# Patient Record
Sex: Male | Born: 1955 | Hispanic: Yes | Marital: Single | Smoking: Current every day smoker
Health system: Southern US, Community
[De-identification: ages and names within clinical notes are randomized; demographics above are authoritative.]

---

## 2015-02-08 ENCOUNTER — Emergency Department (HOSPITAL_BASED_OUTPATIENT_CLINIC_OR_DEPARTMENT_OTHER)
Admission: EM | Admit: 2015-02-08 | Discharge: 2015-02-08 | Disposition: A | Discharge status: Home / Self Care | Payer: Self-pay | Attending: Emergency Medicine | Admitting: Emergency Medicine

## 2015-02-08 ENCOUNTER — Encounter (HOSPITAL_BASED_OUTPATIENT_CLINIC_OR_DEPARTMENT_OTHER): Payer: Self-pay | Admitting: Family Medicine

## 2015-02-08 ENCOUNTER — Emergency Department (HOSPITAL_BASED_OUTPATIENT_CLINIC_OR_DEPARTMENT_OTHER): Payer: Self-pay

## 2015-02-08 DIAGNOSIS — J159 Unspecified bacterial pneumonia: Secondary | ICD-10-CM | POA: Insufficient documentation

## 2015-02-08 DIAGNOSIS — R21 Rash and other nonspecific skin eruption: Secondary | ICD-10-CM | POA: Insufficient documentation

## 2015-02-08 DIAGNOSIS — J189 Pneumonia, unspecified organism: Secondary | ICD-10-CM

## 2015-02-08 DIAGNOSIS — Z720 Tobacco use: Secondary | ICD-10-CM | POA: Insufficient documentation

## 2015-02-08 LAB — COMPREHENSIVE METABOLIC PANEL
ALT: 17 U/L (ref 17–63)
ANION GAP: 7 (ref 5–15)
AST: 21 U/L (ref 15–41)
Albumin: 2.8 g/dL — ABNORMAL LOW (ref 3.5–5.0)
Alkaline Phosphatase: 181 U/L — ABNORMAL HIGH (ref 38–126)
BUN: 12 mg/dL (ref 6–20)
CALCIUM: 8.4 mg/dL — AB (ref 8.9–10.3)
CHLORIDE: 103 mmol/L (ref 101–111)
CO2: 27 mmol/L (ref 22–32)
CREATININE: 0.57 mg/dL — AB (ref 0.61–1.24)
GFR calc Af Amer: >60 mL/min (ref >60)
Glucose, Bld: 112 mg/dL — ABNORMAL HIGH (ref 65–99)
Potassium: 3.8 mmol/L (ref 3.5–5.1)
Sodium: 137 mmol/L (ref 135–145)
Total Bilirubin: 0.3 mg/dL (ref 0.3–1.2)
Total Protein: 6.5 g/dL (ref 6.5–8.1)

## 2015-02-08 LAB — CBC WITH DIFFERENTIAL/PLATELET
BASOS PCT: 0 % (ref 0–1)
Basophils Absolute: 0 10*3/uL (ref 0.0–0.1)
Eosinophils Absolute: 1.8 10*3/uL — ABNORMAL HIGH (ref 0.0–0.7)
Eosinophils Relative: 13 % — ABNORMAL HIGH (ref 0–5)
HEMATOCRIT: 34.7 % — AB (ref 39.0–52.0)
Hemoglobin: 12.3 g/dL — ABNORMAL LOW (ref 13.0–17.0)
LYMPHS ABS: 1.5 10*3/uL (ref 0.7–4.0)
LYMPHS PCT: 11 % — AB (ref 12–46)
MCH: 34.8 pg — ABNORMAL HIGH (ref 26.0–34.0)
MCHC: 35.4 g/dL (ref 30.0–36.0)
MCV: 98.3 fL (ref 78.0–100.0)
MONOS PCT: 13 % — AB (ref 3–12)
Monocytes Absolute: 1.8 10*3/uL — ABNORMAL HIGH (ref 0.1–1.0)
NEUTROS ABS: 8.7 10*3/uL — AB (ref 1.7–7.7)
Neutrophils Relative %: 63 % (ref 43–77)
Platelets: 495 10*3/uL — ABNORMAL HIGH (ref 150–400)
RBC: 3.53 MIL/uL — ABNORMAL LOW (ref 4.22–5.81)
RDW: 12.7 % (ref 11.5–15.5)
WBC: 13.8 10*3/uL — AB (ref 4.0–10.5)

## 2015-02-08 LAB — URINALYSIS, ROUTINE W REFLEX MICROSCOPIC
Bilirubin Urine: NEGATIVE
Glucose, UA: NEGATIVE mg/dL
Ketones, ur: NEGATIVE mg/dL
Leukocytes, UA: NEGATIVE
Nitrite: NEGATIVE
PROTEIN: 100 mg/dL — AB
SPECIFIC GRAVITY, URINE: 1.015 (ref 1.005–1.030)
Urobilinogen, UA: 0.2 mg/dL (ref 0.0–1.0)
pH: 6.5 (ref 5.0–8.0)

## 2015-02-08 LAB — URINE MICROSCOPIC-ADD ON

## 2015-02-08 LAB — APTT: aPTT: 30 seconds (ref 24–37)

## 2015-02-08 LAB — BRAIN NATRIURETIC PEPTIDE: B Natriuretic Peptide: 400.7 pg/mL — ABNORMAL HIGH (ref 0.0–100.0)

## 2015-02-08 LAB — PROTIME-INR
INR: 1.05 (ref 0.00–1.49)
Prothrombin Time: 13.9 seconds (ref 11.6–15.2)

## 2015-02-08 MED ORDER — METRONIDAZOLE 500 MG PO TABS: 500.0000 mg | ORAL_TABLET | Freq: Once | ORAL | Status: DC

## 2015-02-08 MED ORDER — MORPHINE SULFATE 4 MG/ML IJ SOLN
4.0000 mg | INTRAMUSCULAR | Status: DC | PRN
  Administered 2015-02-08: 4 mg via INTRAVENOUS
  Filled 2015-02-08: qty 1

## 2015-02-08 MED ORDER — BACITRACIN ZINC 500 UNIT/GM EX OINT
TOPICAL_OINTMENT | Freq: Once | CUTANEOUS | Status: AC
  Administered 2015-02-08: 17:00:00 via TOPICAL
  Filled 2015-02-08: qty 28.35

## 2015-02-08 MED ORDER — CLINDAMYCIN HCL 150 MG PO CAPS: 450.0000 mg | ORAL_CAPSULE | Freq: Three times a day (TID) | ORAL | Status: DC

## 2015-02-08 MED ORDER — ONDANSETRON HCL 4 MG/2ML IJ SOLN
4.0000 mg | Freq: Once | INTRAMUSCULAR | Status: AC
  Administered 2015-02-08: 4 mg via INTRAVENOUS
  Filled 2015-02-08: qty 2

## 2015-02-08 MED ORDER — SODIUM CHLORIDE 0.9 % IV BOLUS (SEPSIS)
1000.0000 mL | Freq: Once | INTRAVENOUS | Status: AC
Start: 2015-02-08 — End: 2015-02-08
  Administered 2015-02-08: 1000 mL via INTRAVENOUS

## 2015-02-08 MED ORDER — CLINDAMYCIN HCL 150 MG PO CAPS
300.0000 mg | ORAL_CAPSULE | Freq: Once | ORAL | Status: AC
  Administered 2015-02-08: 300 mg via ORAL
  Filled 2015-02-08: qty 2

## 2015-02-08 MED ORDER — DEXTROSE 5 % IV SOLN
1.0000 g | Freq: Once | INTRAVENOUS | Status: AC
  Administered 2015-02-08: 1 g via INTRAVENOUS

## 2015-02-08 MED ORDER — CEFTRIAXONE SODIUM 1 G IJ SOLR
INTRAMUSCULAR | Status: AC
  Filled 2015-02-08: qty 10

## 2015-02-08 NOTE — ED Provider Notes (Signed)
CSN: [id number]     Arrival date & time 02/08/15  1503 History   First MD Initiated Contact with Patient 02/08/15 1511     Chief Complaint  Patient presents with  . Rash     (Consider location/radiation/quality/duration/timing/severity/associated sxs/prior Treatment) HPI   Marvin Olson is a 60 y.o. male complaining of pruritic rash to entire body, patient started having peeling to the hands and feet several weeks ago, peeling is painful, he has bilateral lower extremity edema starting after the rash. He reports that he's had a subjective fever 2 times since the onset of the rash. Patient is homeless but states he has a steady place to stay with a friend inside, he denies any abuse including IV drugs. Patient states that he drinks pretty much daily, sometimes he gets shaky if he doesn't have alcohol, denies history of DTs or seizures. Has not had alcohol in 4 days. States that he hasn't slept in 2 days because of the pain to the hands and the feet. States his last tetanus shot was 7 years ago. No PCP, no PMH. Pt had been applying vaseline to the hands and feet with little relief. No new meds, exposures, tick bites.   History reviewed. No pertinent past medical history. History reviewed. No pertinent past surgical history. No family history on file. History  Substance Use Topics  . Smoking status: Current Every Day Smoker    Types: Cigarettes  . Smokeless tobacco: Not on file  . Alcohol Use: Yes    Review of Systems  10 systems reviewed and found to be negative, except as noted in the HPI.   Allergies  Review of patient's allergies indicates no known allergies.  Home Medications   Prior to Admission medications   Medication Sig Start Date End Date Taking? Authorizing Provider  clindamycin (CLEOCIN) 150 MG capsule Take 3 capsules (450 mg total) by mouth 3 (three) times daily. 02/08/15   Darryl Blumenstein, PA-C   BP 148/89 mmHg  Pulse 77  Temp(Src) 98.3 F (36.8 C) (Oral)  Resp  16  Ht  (1.753 m)  Wt 135 lb (61.236 kg)  BMI 19.93 kg/m2  SpO2 98% Physical Exam  Constitutional: He is oriented to person, place, and time. He appears well-developed and well-nourished. No distress.  HENT:  Head: Normocephalic and atraumatic.  Mouth/Throat: Oropharynx is clear and moist.  Eyes: Conjunctivae and EOM are normal. Pupils are equal, round, and reactive to light.  Neck: Normal range of motion. Neck supple.  Cardiovascular: Normal rate, regular rhythm and intact distal pulses.   Pulmonary/Chest: Effort normal and breath sounds normal.  Abdominal: Soft. Bowel sounds are normal. There is no tenderness.  Musculoskeletal: Normal range of motion. He exhibits tenderness.  Atrophic skin to lower extremities, 3+ pitting edema to distal shin bilaterally.  Neurological: He is alert and oriented to person, place, and time.  Skin: Rash noted. He is not diaphoretic.  Maculopapular, blanchable, confluent lesions to trunk x4 extremities.   Desquamation to palms and soles with active, venous bleeding.  Lesions spare the mucous membranes.  Psychiatric: He has a normal mood and affect.  Nursing note and vitals reviewed.               ED Course  Procedures (including critical care time) Labs Review Labs Reviewed  CBC WITH DIFFERENTIAL/PLATELET - Abnormal; Notable for the following:    WBC 13.8 (*)    RBC 3.53 (*)    Hemoglobin 12.3 (*)    HCT  34.7 (*)    MCH 34.8 (*)    Platelets 495 (*)    Lymphocytes Relative 11 (*)    Monocytes Relative 13 (*)    Eosinophils Relative 13 (*)    Neutro Abs 8.7 (*)    Monocytes Absolute 1.8 (*)    Eosinophils Absolute 1.8 (*)    All other components within normal limits  COMPREHENSIVE METABOLIC PANEL - Abnormal; Notable for the following:    Glucose, Bld 112 (*)    Creatinine, Ser 0.57 (*)    Calcium 8.4 (*)    Albumin 2.8 (*)    Alkaline Phosphatase 181 (*)    All other components within normal limits  URINALYSIS,  ROUTINE W REFLEX MICROSCOPIC - Abnormal; Notable for the following:    Hgb urine dipstick SMALL (*)    Protein, ur 100 (*)    All other components within normal limits  BRAIN NATRIURETIC PEPTIDE - Abnormal; Notable for the following:    B Natriuretic Peptide 400.7 (*)    All other components within normal limits  PROTIME-INR  APTT  URINE MICROSCOPIC-ADD ON  RPR  HIV ANTIBODY (ROUTINE TESTING)  ROCKY MTN SPOTTED FVR ABS PNL(IGG+IGM)  B. BURGDORFI ANTIBODIES  GC/CHLAMYDIA PROBE AMP ([place])    Imaging Review Dg Chest 2 View  02/08/2015   CLINICAL DATA:  Rash for 1 month, hands peeling for 3 weeks, smoker  EXAM: CHEST  2 VIEW  COMPARISON:  None  FINDINGS: Normal heart size, mediastinal contours and pulmonary vascularity.  RIGHT nipple shadow, question faint LEFT nipple shadow.  Small focus of infiltrate in RIGHT mid lung likely in upper lobe near minor fissure.  Remaining lungs clear.  No pleural effusion or pneumothorax.  No acute osseous findings.  IMPRESSION: Minimal infiltrate RIGHT upper lobe.   Electronically Signed   By: Ulyses SouthwardMark  Boles M.D.   On: 02/08/2015 16:15     EKG Interpretation None      MDM   Final diagnoses:  Rash and nonspecific skin eruption  CAP (community acquired pneumonia)    Filed Vitals:   02/08/15 1515 02/08/15 1820  BP: 165/97 148/89  Pulse: 76 77  Temp: 97.6 F (36.4 C) 98.3 F (36.8 C)  TempSrc: Oral Oral  Resp: 18 16  Height: 5\' 9"  (1.753 m)   Weight: 135 lb (61.236 kg)   SpO2: 99% 98%    Medications  morphine 4 MG/ML injection 4 mg (4 mg Intravenous Given 02/08/15 1619)  cefTRIAXone (ROCEPHIN) 1 G injection (not administered)  sodium chloride 0.9 % bolus 1,000 mL (0 mLs Intravenous Stopped 02/08/15 1730)  ondansetron (ZOFRAN) injection 4 mg (4 mg Intravenous Given 02/08/15 1619)  cefTRIAXone (ROCEPHIN) 1 g in dextrose 5 % 50 mL IVPB (0 g Intravenous Stopped 02/08/15 1810)  clindamycin (CLEOCIN) capsule 300 mg (300 mg Oral Given 02/08/15  1715)  bacitracin ointment ( Topical Given 02/08/15 1713)    Leafy HalfJuan R Olson is a pleasant 59 y.o. male presenting with lytic maculopapular rash to all skin surfaces starting 3 weeks ago progressing to, Asian of the palms and soles. Patient reports intermittent fever, he is afebrile in the ED. Patient has a leukocytosis of 13, patient is a heavy alcohol drinker however his liver function tests and coags are normal. There is significant bilateral lower extremity pitting edema. States that this started after the onset of rash. Lesions affecting any other mucous membranes.  Chest x-ray shows a right middle lobe infiltrate. Patient denies cough, chest pain, shortness of breath, patient  will be started on Rocephin and clindamycin for possible early aspiration pneumonia. Patient is homeless however he is living with friends, a spoken to him and these friends on return precautions. I've advised him to follow closely with the wellness Center. Patient states that he's tried to get Medicaid but was told that without being sick he did not qualify.  Evaluation does not show pathology that would require ongoing emergent intervention or inpatient treatment. Pt is hemodynamically stable and mentating appropriately. Discussed findings and plan with patient/guardian, who agrees with care plan. All questions answered. Return precautions discussed and outpatient follow up given.   Discharge Medication List as of 02/08/2015  6:04 PM    START taking these medications   Details  clindamycin (CLEOCIN) 150 MG capsule Take 3 capsules (450 mg total) by mouth 3 (three) times daily., Starting 02/08/2015, Until Discontinued, Print           l  Wynetta Emeryicole Kameron Blethen, PA-C 02/08/15 1932  Glynn OctaveStephen Rancour, MD 02/09/15 1320

## 2015-02-08 NOTE — ED Notes (Signed)
Ambulated patient O2 stayed between 100-96%

## 2015-02-08 NOTE — ED Notes (Signed)
Pt c/o rash x 1 month and hands peeling x 3 weeks. Denies fever, chills, n/v/d.

## 2015-02-08 NOTE — Discharge Instructions (Signed)
Do not hesitate to return to the Emergency Department for any new, worsening or concerning symptoms.   If you do not have a primary care doctor you can establish one at the   [facility]: [address] [place] [id number] [phone]  After you establish care. Let them know you were seen in the emergency room. They must obtain records for further management.    Do not apply Vaseline to the affected area, use a topical antibiotic ointment like the tracer Neosporin. Do not hesitate to return to the emergency room for any new, worsening or concerning symptoms.

## 2015-02-09 LAB — HIV ANTIBODY (ROUTINE TESTING W REFLEX): HIV Screen 4th Generation wRfx: NONREACTIVE

## 2015-02-09 LAB — RPR: RPR Ser Ql: NONREACTIVE

## 2015-02-09 LAB — B. BURGDORFI ANTIBODIES: B burgdorferi Ab IgG+IgM: <0.91 {ISR} (ref 0.00–0.90)

## 2015-02-11 LAB — ROCKY MTN SPOTTED FVR ABS PNL(IGG+IGM)
RMSF IGG: NEGATIVE
RMSF IGM: 0.42 {index} (ref 0.00–0.89)

## 2015-02-12 ENCOUNTER — Ambulatory Visit: Payer: Self-pay | Attending: Family Medicine | Admitting: Family Medicine

## 2015-02-12 VITALS — BP 150/82 | HR 98 | Wt 148.4 lb

## 2015-02-12 DIAGNOSIS — I10 Essential (primary) hypertension: Secondary | ICD-10-CM | POA: Insufficient documentation

## 2015-02-12 DIAGNOSIS — B352 Tinea manuum: Secondary | ICD-10-CM | POA: Insufficient documentation

## 2015-02-12 DIAGNOSIS — R21 Rash and other nonspecific skin eruption: Secondary | ICD-10-CM

## 2015-02-12 LAB — SEDIMENTATION RATE: Sed Rate: 25 mm/hr — ABNORMAL HIGH (ref 0–20)

## 2015-02-12 LAB — C-REACTIVE PROTEIN: CRP: 2.9 mg/dL — ABNORMAL HIGH (ref <0.60)

## 2015-02-12 MED ORDER — HYDROCHLOROTHIAZIDE 25 MG PO TABS: 25.0000 mg | ORAL_TABLET | Freq: Every day | ORAL | Status: DC

## 2015-02-12 MED ORDER — DIPHENHYDRAMINE HCL 25 MG PO CAPS: 25.0000 mg | ORAL_CAPSULE | Freq: Four times a day (QID) | ORAL | Status: DC | PRN

## 2015-02-12 NOTE — Progress Notes (Signed)
Patient ID: Marvin Olson, male   DOB: July 18, 1956, 59 y.o.   MRN: [id number]   Marvin Olson, is a 59 y.o. male  NTI:[id number]  QQP:[id number]  DOB - 03/02/1956  CC: No chief complaint on file.      HPI: Marvin Olson is a 59 y.o. male here today to establish medical care. He was seen in ED on 5/20 for non-specific skin rash and CAP. He is being treated with Clindamycin and reports today for follow-up. Currently is only medication is the clindamycin. He is using bactitracin provided in ED on his skin. He states he was unaware of a pulmonary condition before going to the ED. A x-ray showed infiltrate on the right, probably in upper lobe.  He is homeless but lives with a friend.   No Known Allergies No past medical history on file. Current Outpatient Prescriptions on File Prior to Visit  Medication Sig Dispense Refill  . clindamycin (CLEOCIN) 150 MG capsule Take 3 capsules (450 mg total) by mouth 3 (three) times daily. 90 capsule 0   No current facility-administered medications on file prior to visit.   No family history on file. History   Social History  . Marital Status: Single    Spouse Name: N/A  . Number of Children: N/A  . Years of Education: N/A   Occupational History  . Not on file.   Social History Main Topics  . Smoking status: Current Every Day Smoker    Types: Cigarettes  . Smokeless tobacco: Not on file  . Alcohol Use: Yes  . Drug Use: No  . Sexual Activity: Not on file   Other Topics Concern  . Not on file   Social History Narrative  . No narrative on file    Review of Systems: Constitutional: Negative for fever, chills,  weight loss,  Fatigue.Positive for fluctuation in appetite. He reports sweating a lot. HENT: Negative for ear pain, ear discharge.nose bleeds. Positive for some seasonal allergy symptoms Eyes: Negative for pain, discharge, redness, itching and visual disturbance.Positive fore occassional burning of eyes. Neck: Negative for pain,  stiffness Respiratory: Negative for  shortness of breath,  Positive for chronic cough, especially at night. Cardiovascular: Negative for chest pain, palpitations. Positive for swelling of feet and legs Gastrointestinal: Negative for abdominal distention, abdominal pain, nausea, vomiting,  Constipations. Positive for intermittent diarrhea and some abdominal pain related to hernia Genitourinary: Negative for dysuria, urgency, frequency, hematuria, flank pain,  Musculoskeletal: Negative for back pain, joint pain, joint  swelling, arthralgia and gait problem.Negative for weakness. Positive for difficulty walking at times due to swelling. Uses a cane at times. Neurological: Negative for dizziness,, seizures, syncope,  numbness and  Frequent headaches. Positive for tremors, off-balance Hematological: Negative for easy bruising or bleeding Psychiatric/Behavioral: Negative for anxiety, decreased concentration, confusion. Admits to occassional situational depression   Objective:  There were no vitals filed for this visit.  Physical Exam: Constitutional: Patient appears well-developed and well-nourished. No distress. HENT: Normocephalic, atraumatic, External right and left ear normal. Oropharynx is clear and moist. Teeth in poor repair Eyes: Conjunctivae and EOM are normal. PERRLA, no scleral icterus. Neck: Normal ROM. Neck supple. No lymphadenopathy, No thyromegaly. CVS: RRR, S1/S2 +, no murmurs, no gallops, no rubs Pulmonary: Effort and breath sounds normal, no stridor, rhonchi, rales. Positivie for faint high-pitched wheeze in left upper lobe. Abdominal: Soft. Normoactive BS,, no distension, tenderness, rebound or guarding.  Musculoskeletal: Normal range of motion  Positive for 3+ edema and  Tenderness of lower legs.  Neuro: Alert.Normal muscle tone coordination. Non-focal Skin: Skin is warm and dry. Not diaphoretic. No erythema. No pallor.There is a wide spread undefined rash with peeling of palms  and soles. Psychiatric: Normal mood and affect. Behavior, judgment, thought content normal.  Lab Results  Component Value Date   WBC 13.8* 02/08/2015   HGB 12.3* 02/08/2015   HCT 34.7* 02/08/2015   MCV 98.3 02/08/2015   PLT 495* 02/08/2015   Lab Results  Component Value Date   CREATININE 0.57* 02/08/2015   BUN 12 02/08/2015   NA 137 02/08/2015   K 3.8 02/08/2015   CL 103 02/08/2015   CO2 27 02/08/2015    No results found for: HGBA1C Lipid Panel  No results found for: CHOL, TRIG, HDL, CHOLHDL, VLDL, LDLCALC     Assessment and plan:   1. Non-specific rash     A. ESR and CRP     B. Continue bacitracin     C. Referral to dermatology when financial aid established. 2. Hypertension with pedal edema     A. HCTZ 25 mg. qday 3. Tremors and off-balance at times     Follow-up with PCP      1,2,3, Follow-up with PCP within next 2 weeks.    The patient was given clear instructions to go to ER or return to medical center if symptoms don't improve, worsen or new problems develop. The patient verbalized understanding. The patient was told to call to get lab results if they haven't heard anything in the next week.        Micheline Chapman, MSN, FNP-BC [place], [place]   02/12/2015, 9:13 AM

## 2015-02-12 NOTE — Patient Instructions (Addendum)
1.  See financial people about financial assistance. 2.  Make an appointment with assigned primary provider in one to two weeks. 3.  I am prescribing medication for hypertension and swelling of feet and legs.Take as instructed. 4.  I am prescribing benadryl for itching.  5.  You can get a good skin lotion at the [facility] for $1.00. I would recommend applying twice a day.  6.   We are drawing some additional lab work today. Will call with results. 7.  Once financial aid in place with try to get dermatology referral.

## 2015-02-13 MED ORDER — TRIAMCINOLONE ACETONIDE 0.1 % EX CREA: 1.0000 "application " | TOPICAL_CREAM | Freq: Two times a day (BID) | CUTANEOUS | Status: DC

## 2015-02-13 NOTE — Addendum Note (Signed)
Addended by: Henrietta HooverBERNHARDT, LINDA C on: 02/13/2015 04:09 PM   Modules accepted: Orders

## 2015-02-19 ENCOUNTER — Ambulatory Visit: Payer: Self-pay | Attending: Physician Assistant

## 2015-02-26 ENCOUNTER — Ambulatory Visit: Payer: Self-pay | Attending: Family Medicine | Admitting: Family Medicine

## 2015-02-26 ENCOUNTER — Encounter: Payer: Self-pay | Admitting: Family Medicine

## 2015-02-26 VITALS — BP 137/74 | HR 69 | Temp 98.8°F | Resp 16 | Ht 69.0 in | Wt 142.0 lb

## 2015-02-26 DIAGNOSIS — M2540 Effusion, unspecified joint: Secondary | ICD-10-CM | POA: Insufficient documentation

## 2015-02-26 DIAGNOSIS — I10 Essential (primary) hypertension: Secondary | ICD-10-CM | POA: Insufficient documentation

## 2015-02-26 DIAGNOSIS — R21 Rash and other nonspecific skin eruption: Secondary | ICD-10-CM | POA: Insufficient documentation

## 2015-02-26 DIAGNOSIS — F1010 Alcohol abuse, uncomplicated: Secondary | ICD-10-CM | POA: Insufficient documentation

## 2015-02-26 LAB — CBC
HCT: 34.7 % — ABNORMAL LOW (ref 39.0–52.0)
HEMOGLOBIN: 12 g/dL — AB (ref 13.0–17.0)
MCH: 34.3 pg — AB (ref 26.0–34.0)
MCHC: 34.6 g/dL (ref 30.0–36.0)
MCV: 99.1 fL (ref 78.0–100.0)
MPV: 8 fL — ABNORMAL LOW (ref 8.6–12.4)
Platelets: 655 10*3/uL — ABNORMAL HIGH (ref 150–400)
RBC: 3.5 MIL/uL — ABNORMAL LOW (ref 4.22–5.81)
RDW: 14 % (ref 11.5–15.5)
WBC: 11.6 10*3/uL — AB (ref 4.0–10.5)

## 2015-02-26 LAB — URIC ACID: Uric Acid, Serum: 5.2 mg/dL (ref 4.0–7.8)

## 2015-02-26 MED ORDER — DIPHENHYDRAMINE HCL 25 MG PO CAPS: 25.0000 mg | ORAL_CAPSULE | Freq: Four times a day (QID) | ORAL | Status: DC | PRN

## 2015-02-26 MED ORDER — AMLODIPINE BESYLATE 5 MG PO TABS: 5.0000 mg | ORAL_TABLET | Freq: Every day | ORAL | Status: DC

## 2015-02-26 MED ORDER — VITAMIN B-1 100 MG PO TABS: 100.0000 mg | ORAL_TABLET | Freq: Every day | ORAL | Status: AC

## 2015-02-26 MED ORDER — HYDROCHLOROTHIAZIDE 12.5 MG PO TABS: 12.5000 mg | ORAL_TABLET | Freq: Every day | ORAL | Status: DC

## 2015-02-26 MED ORDER — FOLIC ACID 1 MG PO TABS: 1.0000 mg | ORAL_TABLET | Freq: Every day | ORAL | Status: AC

## 2015-02-26 MED ORDER — PERMETHRIN 5 % EX CREA: TOPICAL_CREAM | CUTANEOUS | Status: DC

## 2015-02-26 NOTE — Patient Instructions (Signed)
Mr. Marvin Olson,   Thank you for coming in today.   1.  I suspect scabies: permetherin cream ordered Stop steroid cream Benadryl for itching increase to 50 mg   2.  Hypertension: Decrease HCTZ to 12.5 mg daily Add norvasc 5 mg daily Checking uric acid level  3. Alcohol abuse history: Start folic acid and thiamine  F/u in 4 weeks for skin rash, presumed scabies and hypertension  Dr. Armen PickupFunches

## 2015-02-26 NOTE — Assessment & Plan Note (Signed)
I suspect scabies: permetherin cream ordered Stop steroid cream Benadryl for itching increase to 50 mg

## 2015-02-26 NOTE — Progress Notes (Signed)
   Subjective:    Patient ID: Marvin Olson, male    DOB: 1956/05/11, 59 y.o.   MRN: [id number] CC: skin rash, L elbow swelling  Spanish interpreter present  HPI  1. Rash: x 5 weeks. Improving with less scaling of feet and hands. Still intensely pruritic. Finished clindamycin. Kenalog cream helps with some of the itching. Benadryl helps as well. No fever or chills. Former ETOH abuse. Homeless, but currently staying with a friend. No close contacts with rash. Rash is on extremities, scalp, upper back, groin.   2. ETOH abuse: former heavy drinker. Has slight tremor. Stopped drinking 4  Weeks ago. No withdrawal seizures.   3. Joint swelling: in ankles and L elbow. Joints are not painful. Patient has no known gout. Patient taking HCTZ for HTN and leg swelling. Leg swelling has improved.   Soc Hx: smokes 1/2PPD   Review of Systems  Constitutional: Negative for fever and chills.  Skin: Positive for rash.  Neurological: Positive for tremors.      Objective:   Physical Exam BP 137/74 mmHg  Pulse 69  Temp(Src) 98.8 F (37.1 C) (Oral)  Resp 16  Ht 5\' 9"  (1.753 m)  Wt 142 lb (64.411 kg)  BMI 20.96 kg/m2  SpO2 99%  BP Readings from Last 3 Encounters:  02/26/15 137/74  02/12/15 150/82  02/08/15 148/89  General appearance: alert, cooperative and no distress Head: Normocephalic, without obvious abnormality, atraumatic, scalp lesions, crusted papules with evidence of excoriation  Throat: poor dentition with loose teeth. no oral lesions.  Extremities: edema trace edema at ankles and feet , Left elbow with swelling at olecranon bursa, no erythema. Non tender  Skin: erythematous papules on hands, wrist, back, legs. Scaling of soles. Erythematous macular rash in groin area.      Assessment & Plan:

## 2015-02-26 NOTE — Progress Notes (Signed)
Establish Care with PCP F/U body itching and bumps  Complaining of feet swelling, lt elbow swelling  Hx Tobacco-1 pper two days

## 2015-02-26 NOTE — Assessment & Plan Note (Signed)
Hypertension: Decrease HCTZ to 12.5 mg daily Add norvasc 5 mg daily Checking uric acid level

## 2015-02-26 NOTE — Progress Notes (Signed)
ASSESSMENT: Pt currently experiencing suicide ideation, as a result of symptoms of depression and psychosocial stressors. [facility], and [facility]. [facility].  Stage of Change: cognitive  PLAN: 1. F/U with behavioral health consultant in as needed 2. Psychiatric Medications: none. 3. Behavioral recommendation(s):   -[facility] -[facility] upon [facility]  SUBJECTIVE: Pt. referred by Dr Armen PickupFunches for risk assessment:  Pt. here for referral regarding risk assessment.  Pt. reports the following symptoms/concerns: Pt states that he has felt suicidal since last night. Pt says that he has thought about cutting his wrists or hanging himself, and that "if I go home, I think I'm gonna just lose it".  Duration of problem: >24 hours(suicidality), symptoms of depression, <2 weeks Severity: severe  OBJECTIVE: Orientation & Cognition: Oriented x3. Thought processes normal and appropriate to situation. Mood: low. Affect: appropriate Appearance: appropriate Risk of harm to self or others: high risk of harm to self, no risk of harm to others Substance use: alcohol Psychiatric medication use: Unchanged from prior contact. Assessments administered: PSQ9-18/ GAD7-14  Diagnosis: Major depressive disorder, single episode CPT Code: F32.9 -------------------------------------------- Other(s) present in the room: none  Time spent with patient in exam room: 30 minutes

## 2015-02-26 NOTE — Assessment & Plan Note (Signed)
Alcohol abuse history: Start folic acid and thiamine

## 2015-03-06 ENCOUNTER — Telehealth: Payer: Self-pay | Admitting: *Deleted

## 2015-03-06 NOTE — Telephone Encounter (Signed)
-----   Message from Dessa Phi, MD sent at 02/27/2015 12:58 PM EDT ----- Improved WBC Stable hgb  Normal uric acid

## 2015-03-06 NOTE — Telephone Encounter (Signed)
Pt aware of results 

## 2015-03-26 ENCOUNTER — Encounter: Payer: Self-pay | Admitting: Family Medicine

## 2015-03-26 ENCOUNTER — Ambulatory Visit: Payer: Self-pay | Attending: Family Medicine | Admitting: Family Medicine

## 2015-03-26 VITALS — BP 152/79 | HR 79 | Temp 98.3°F | Resp 18 | Ht 67.5 in | Wt 142.0 lb

## 2015-03-26 DIAGNOSIS — B353 Tinea pedis: Secondary | ICD-10-CM

## 2015-03-26 DIAGNOSIS — H00013 Hordeolum externum right eye, unspecified eyelid: Secondary | ICD-10-CM

## 2015-03-26 DIAGNOSIS — I10 Essential (primary) hypertension: Secondary | ICD-10-CM

## 2015-03-26 DIAGNOSIS — H00019 Hordeolum externum unspecified eye, unspecified eyelid: Secondary | ICD-10-CM | POA: Insufficient documentation

## 2015-03-26 DIAGNOSIS — H00016 Hordeolum externum left eye, unspecified eyelid: Secondary | ICD-10-CM

## 2015-03-26 MED ORDER — HYDROCHLOROTHIAZIDE 25 MG PO TABS: 25.0000 mg | ORAL_TABLET | Freq: Every day | ORAL | Status: DC

## 2015-03-26 MED ORDER — KETOCONAZOLE 2 % EX CREA: 1.0000 "application " | TOPICAL_CREAM | Freq: Every day | CUTANEOUS | Status: DC

## 2015-03-26 MED ORDER — KETOCONAZOLE 2 % EX SHAM: 1.0000 "application " | MEDICATED_SHAMPOO | CUTANEOUS | Status: DC

## 2015-03-26 NOTE — Assessment & Plan Note (Signed)
  Skin itching:  Fungal infection on scalp, palms and soles suspected   Use shampoo twice weekly Use cream on hands and feet twice daily

## 2015-03-26 NOTE — Progress Notes (Signed)
Patient here with complaints of itching all over his body.  Patient reports itching in hands and feet after holding a shovel/pick (for work) for about 2-3 seconds.  Patient states he wants to be able to work again so he can afford his medications.  Patient reports bump on right upper eyelid that appeared last night and itches, as well as bumps on his head that itch.  Patient also reports bumps on right shin that are purulent and itch/bleed when scratched.  Patient has no other visible bumps, but continues to itch all over "from the inside out" over the past month.

## 2015-03-26 NOTE — Patient Instructions (Addendum)
Mr. Fenton MallingRuiz,  Thank you for coming in today  1. HTN: Goal BP < 140/90 Take HCTZ 25 mg once daily   2. Stye: Warm compress 3-4 times daily for 5-10 mintues Place rice in sock or pillow case heat for 60 seconds and test against your arm to make sure it is not too hot Or  Warm a wash clothe under a water  Until swelling resolves  3. Skin itching:  Fungal infection on scalp, palms and soles suspected   Use shampoo twice weekly Use cream on hands and feet twice daily   No pills at this time.   F/u in 3 weeks with RN for BP check  F/u in 6 weeks with me for HTN and skin rash with itching   Dr. Evalee JeffersonFunches     Orzuelo (Sty) Se trata de una infeccin en una glndula del prpado, Haitiubicada en la base de Emmonsuna pestaa. Una orzuelo puede desarrollar un punto de pus blanco o amarillo. Puede inflamarse. Generalmente el orzuelo se abre y el pus comienza a salir espontneamente. Una vez que drenan, no dejan bulto en el prpado. Un orzuelo a menudo se confunde con otra forma de quiste del prpado que se denomina chalazion. El chalazion aparece dentro del prpado y no en el borde en el que se encuentran las bases de las Crab Orchardpestaas. A menudo son rojizos, duelen y forman bultos en el prpado. CAUSAS  Grmenes (bacterias).  Inflamacin del prpado de larga duracin (crnica). SNTOMAS  Molestias, enrojecimiento e inflamacin en el borde del prpado en la base de las pestaas.  A veces puede desarrollar un punto de pus blanco o amarillo. Puede drenar o no. DIAGNSTICO Un oftalmlogo podr distinguir entre un orzuelo y un chalazin y tratar la enfermedad.  TRATAMIENTO  Los orzuelos normalmente se tratan con compresas calientes [place] que drenen.  En pocos caos, el profesional que lo asiste podr prescribirle medicamentos que destruyen grmenes (antibiticos). Estos antibiticos podrn prescribirse en forma de gotas, cremas o pldoras.  Si se forma un bulto duro, en general ser necesario realizar una  pequea incisin y eliminar la parte endurecida del quiste en un procedimiento de ciruga menor que se Electronics engineerrealiza en el consultorio.  En algunos casos, el mdico podr enviar el contenido del quiste al laboratorio para asegurarse de que no es una forma de cncer rara pero peligroso de las glndulas del prpado. INSTRUCCIONES PARA EL CUIDADO DOMICILIARIO  Lave sus manos con frecuencia y squelas con una toalla limpia. Evite tocarse el prpado. Esto puede diseminar la infeccin a otras partes del ojo.  Aplique calor sobre el prpado durante 10 a 20 minutos varias veces por da para Engineer, materialsaliviar el dolor y ayudar a que se cure ms rpidamente.  No apriete el orzuelo. Permita que drene slo. Lvese el prpado cuidadosamente 3  4 veces por da para retirar el pus. SOLICITE ATENCIN MDICA DE INMEDIATO SI:  Comienza a sentir dolor en el ojo, o se le hincha.  La visin se modifica.  El orzuelo no drena por s mismo en 3 das.  El orzuelo aparece nuevamente despus de un breve perodo, an con tratamiento.  Observa enrojecimiento (inflamacin) alrededor del ojo.  Tiene fiebre. Document Released: 06/17/2005 Document Revised: 11/30/2011 [facility] Patient Information 2015 [employer] [place]. This information is not intended to replace advice given to you by your health care provider. Make sure you discuss any questions you have with your health care provider.

## 2015-03-26 NOTE — Assessment & Plan Note (Signed)
HTN: BP above goal  Goal BP < 140/90 Take HCTZ 25 mg once daily

## 2015-03-26 NOTE — Assessment & Plan Note (Signed)
Stye: Warm compress 3-4 times daily for 5-10 mintues Place rice in sock or pillow case heat for 60 seconds and test against your arm to make sure it is not too hot Or  Warm a wash clothe under a water  Until swelling resolves

## 2015-03-26 NOTE — Progress Notes (Signed)
   Subjective:    Patient ID: Marvin Olson, male    DOB: Aug 18, 1956, 59 y.o.   MRN: [id number] CC: f/u skin rash reports itching, and HTN  Spanish interpreter present  HPI CHRONIC HYPERTENSION  Disease Monitoring  Blood pressure range: not checking   Chest pain: no   Dyspnea: no   Claudication: no   Medication compliance: yes, but only taking HCTZ and not taking norvasc   Medication Side Effects  Lightheadedness: no   Urinary frequency: no   Edema: no   Impotence: no    2. Skin rash: Rash: started 11 weeks ago. Improved drastically. Patient not longer with less scaling of feet and hands. Still intensely pruritic mostly in palms, soles and scalp. Finished clindamycin. Patient never used permetherin cream. At last visit he reported that kenalog cream helps with some of the itching. Benadryl helps as well. No fever or chills. Former ETOH abuse. Homeless, but currently staying with a friend. No close contacts with rash.   3. Stye: L upper lid. Started one day ago. No fever. No pain. Mild swelling. No eye trauma.   Soc Hx: non smoker  Review of Systems  Constitutional: Negative for fever, chills, fatigue and unexpected weight change.  Eyes: Negative for visual disturbance.  Respiratory: Negative for cough and shortness of breath.   Cardiovascular: Negative for chest pain, palpitations and leg swelling.  Gastrointestinal: Negative for nausea, vomiting, abdominal pain, diarrhea, constipation and blood in stool.  Musculoskeletal: Negative for myalgias, back pain, arthralgias, gait problem and neck pain.  Skin: Positive for rash.       Objective:   Physical Exam BP 152/79 mmHg  Pulse 79  Temp(Src) 98.3 F (36.8 C) (Oral)  Resp 18  Ht 5' 7.5" (1.715 m)  Wt 142 lb (64.411 kg)  BMI 21.90 kg/m2  SpO2 99% General appearance: alert, cooperative and no distress  Scalp: non tender, large scales, no mites or lice noted  Lungs: normal WOB  Skin: evidence of excoriation on scalp, hands  and feet. Thickened skins with fissures on palms and soles.      Assessment & Plan:

## 2015-04-23 ENCOUNTER — Ambulatory Visit: Payer: Self-pay | Attending: Family Medicine | Admitting: Pharmacist

## 2015-04-23 VITALS — BP 157/86 | HR 74

## 2015-04-23 DIAGNOSIS — R21 Rash and other nonspecific skin eruption: Secondary | ICD-10-CM | POA: Insufficient documentation

## 2015-04-23 DIAGNOSIS — B353 Tinea pedis: Secondary | ICD-10-CM | POA: Insufficient documentation

## 2015-04-23 MED ORDER — KETOCONAZOLE 2 % EX SHAM: 1.0000 "application " | MEDICATED_SHAMPOO | CUTANEOUS | Status: DC

## 2015-04-23 MED ORDER — DIPHENHYDRAMINE HCL 25 MG PO CAPS: 25.0000 mg | ORAL_CAPSULE | Freq: Four times a day (QID) | ORAL | Status: DC | PRN

## 2015-04-23 MED ORDER — KETOCONAZOLE 2 % EX CREA: 1.0000 "application " | TOPICAL_CREAM | Freq: Every day | CUTANEOUS | Status: DC

## 2015-04-23 NOTE — Patient Instructions (Addendum)
Please schedule a nurse visit in once week for blood pressure check.  Hipertensin (Hypertension) La hipertensin, conocida comnmente como presin arterial alta, se produce cuando la sangre bombea en las arterias con mucha fuerza. Las arterias son los vasos sanguneos que transportan la sangre desde el corazn hacia todas las partes del cuerpo. Una lectura de la presin arterial consiste en un nmero ms alto sobre un nmero ms bajo, por ejemplo, 110/72. El nmero ms alto (presin sistlica) corresponde a la presin interna de las arterias cuando el corazn [place]. El nmero ms bajo (presin diastlica) corresponde a la presin interna de las arterias cuando el corazn se relaja. En condiciones ideales, la presin arterial debe ser inferior a 120/80. La hipertensin fuerza al corazn a trabajar ms para Marine scientist. Las arterias pueden estrecharse o ponerse rgidas. La hipertensin conlleva el riesgo de enfermedad cardaca, ictus y otros problemas.  FACTORES DE RIESGO Algunos factores de riesgo de hipertensin son controlables, pero otros no lo son.  [employer] factores de riesgo que usted no puede Chief Operating Officer, se incluyen:   Nurse, learning disability. El riesgo es mayor para las Statistician.  La edad. Los riesgos aumentan con la edad.  El sexo. Antes de los 45aos, los hombres corren ms [employer]. Despus de los 65aos, las mujeres corren ms [employer]. Entre los factores de riesgo que usted puede Chief Operating Officer, se incluyen:  No hacer la cantidad suficiente de actividad fsica o ejercicio.  Tener sobrepeso.  Consumir mucha grasa, azcar, caloras o sal en la dieta.  Beber alcohol en exceso. SIGNOS Y SNTOMAS Por lo general, la hipertensin no causa signos o sntomas. La hipertensin demasiado alta (crisis hipertensiva) puede causar dolor de cabeza, ansiedad, falta de aire y hemorragia nasal. DIAGNSTICO  Para detectar si usted tiene hipertensin, el  mdico le medir la presin arterial mientras est sentado, con el brazo levantado a la altura del corazn. Debe medirla al [facility] veces en el mismo brazo. Determinadas condiciones pueden causar una diferencia de presin arterial entre el brazo izquierdo y Aeronautical engineer. El hecho de tener una sola lectura de la presin arterial ms alta que lo normal no significa que Research scientist (physical sciences). En el caso de tener una lectura de la presin arterial con un valor alto, pdale al mdico que la verifique nuevamente. TRATAMIENTO  El tratamiento de la hipertensin arterial incluye hacer cambios en el estilo de vida y, posiblemente, tomar medicamentos. Un estilo de vida saludable puede ayudar a bajar la presin arterial alta. Quiz deba cambiar algunos hbitos. Los [employer] en el estilo de vida pueden incluir:  Seguir la dieta DASH. Esta dieta tiene un alto contenido de frutas, verduras y Radiation protection practitioner. Incluye poca cantidad de sal, carnes rojas y azcares agregados.  Hacer al menos 2horas de actividad fsica enrgica todas las semanas.  Perder peso, si es necesario.  No fumar.  Limitar el consumo de bebidas alcohlicas.  Aprender formas de reducir el estrs. Si los cambios en el estilo de vida no son suficientes para Museum/gallery curator la presin arterial, el mdico puede recetarle medicamentos. Quiz necesite tomar ms de uno. Trabaje en conjunto con su mdico para comprender los riesgos y los beneficios. INSTRUCCIONES PARA EL CUIDADO EN EL HOGAR  Haga que le midan de nuevo la presin arterial segn las indicaciones del mdico.  Tome los medicamentos solamente como se lo haya indicado el mdico. Siga cuidadosamente las indicaciones. Los medicamentos para la presin arterial deben tomarse segn las  indicaciones. Los medicamentos pierden eficacia al omitir las dosis. El hecho de omitir las dosis tambin [place] el riesgo de otros problemas.  No fume.  Contrlese la presin arterial en su casa  segn las indicaciones del mdico. SOLICITE ATENCIN MDICA SI:   Piensa que tiene una reaccin alrgica a los medicamentos.  Tiene mareos o dolores de cabeza con Naval architect.  Tiene hinchazn en los tobillos.  Tiene problemas de visin. SOLICITE ATENCIN MDICA DE INMEDIATO SI:  Siente un dolor de cabeza intenso o confusin.  Siente debilidad inusual, adormecimiento o que Hospital doctor.  Siente dolor intenso en el pecho o en el abdomen.  Vomita repetidas veces.  Tiene dificultad para respirar. ASEGRESE DE QUE:   Comprende estas instrucciones.  Controlar su afeccin.  Recibir ayuda de inmediato si no mejora o si empeora. Document Released: 09/07/2005 Document Revised: 01/22/2014 [facility] Patient Information 2015 [place], [place]. This information is not intended to replace advice given to you by your health care provider. Make sure you discuss any questions you have with your health care provider.

## 2015-04-23 NOTE — Progress Notes (Signed)
PI [id number]  S:    Patient arrives to clinic in distress due to itching.  Patient is Spanish speaking and a Education officer, community (ID [phone]) was used for the entirety of the visit. He presents to the clinic for hypertension evaluation.   Patient denies adherence with medications. He reports that he has been out of all of his medications for about a week.  Current BP Medications include:  Hydrochlorothiazide 25 mg daily  He complains of "severe itching" and is constantly scratching his arms and legs during the visit. He appears to be distressed and upset due to the itching. He reports that he has been out of diphenhydramine for days.    O:   Last 3 Office BP readings: BP Readings from Last 3 Encounters:  04/23/15 157/86  03/26/15 152/79  02/26/15 137/74    BMET    Component Value Date/Time   NA 137 02/08/2015 1550   K 3.8 02/08/2015 1550   CL 103 02/08/2015 1550   CO2 27 02/08/2015 1550   GLUCOSE 112* 02/08/2015 1550   BUN 12 02/08/2015 1550   CREATININE 0.57* 02/08/2015 1550   CALCIUM 8.4* 02/08/2015 1550   GFRNONAA >60 02/08/2015 1550   GFRAA >60 02/08/2015 1550    A/P:  History of hypertension which currently is uncontrolled on current medications likely due to distress from itching. Do not recommend any changes to his medications at this time as I suspect his blood pressure would be lower if he was not so upset and constantly scratching. Instructed patient on how to use the ketoconazole shampoo and cream. Also instructed patient to pick up diphenhydramine and hydrochlorothiazide from the pharmacy. Results reviewed and written information provided.   F/U Clinic Visit with Dr. Armen Pickup as needed for urticaria. Schedule a nurse visit in one week for blood pressure check once patient has resumed hydrochlorothiazide. Total time in face-to-face counseling 20 minutes.  Patient seen with Hazle Nordmann, PharmD Resident.

## 2015-05-03 ENCOUNTER — Encounter: Payer: Self-pay | Admitting: Family Medicine

## 2015-05-03 ENCOUNTER — Ambulatory Visit: Payer: Self-pay | Attending: Family Medicine | Admitting: Family Medicine

## 2015-05-03 VITALS — BP 114/71 | HR 80 | Temp 98.0°F | Resp 16 | Wt 136.6 lb

## 2015-05-03 DIAGNOSIS — L298 Other pruritus: Secondary | ICD-10-CM | POA: Insufficient documentation

## 2015-05-03 DIAGNOSIS — L299 Pruritus, unspecified: Secondary | ICD-10-CM | POA: Insufficient documentation

## 2015-05-03 DIAGNOSIS — E559 Vitamin D deficiency, unspecified: Secondary | ICD-10-CM

## 2015-05-03 DIAGNOSIS — I10 Essential (primary) hypertension: Secondary | ICD-10-CM

## 2015-05-03 DIAGNOSIS — R42 Dizziness and giddiness: Secondary | ICD-10-CM | POA: Insufficient documentation

## 2015-05-03 DIAGNOSIS — B352 Tinea manuum: Secondary | ICD-10-CM

## 2015-05-03 DIAGNOSIS — R21 Rash and other nonspecific skin eruption: Secondary | ICD-10-CM

## 2015-05-03 DIAGNOSIS — F17210 Nicotine dependence, cigarettes, uncomplicated: Secondary | ICD-10-CM | POA: Insufficient documentation

## 2015-05-03 DIAGNOSIS — B353 Tinea pedis: Secondary | ICD-10-CM

## 2015-05-03 LAB — GLUCOSE, POCT (MANUAL RESULT ENTRY): POC GLUCOSE: 99 mg/dL (ref 70–99)

## 2015-05-03 LAB — CBC
HEMATOCRIT: 34.9 % — AB (ref 39.0–52.0)
Hemoglobin: 11.9 g/dL — ABNORMAL LOW (ref 13.0–17.0)
MCH: 32.2 pg (ref 26.0–34.0)
MCHC: 34.1 g/dL (ref 30.0–36.0)
MCV: 94.6 fL (ref 78.0–100.0)
MPV: 8.4 fL — ABNORMAL LOW (ref 8.6–12.4)
Platelets: 449 10*3/uL — ABNORMAL HIGH (ref 150–400)
RBC: 3.69 MIL/uL — ABNORMAL LOW (ref 4.22–5.81)
RDW: 13.5 % (ref 11.5–15.5)
WBC: 8.2 10*3/uL (ref 4.0–10.5)

## 2015-05-03 LAB — POCT GLYCOSYLATED HEMOGLOBIN (HGB A1C): Hemoglobin A1C: 5.5

## 2015-05-03 LAB — COMPLETE METABOLIC PANEL WITH GFR
ALT: 12 U/L (ref 9–46)
AST: 18 U/L (ref 10–35)
Albumin: 3.6 g/dL (ref 3.6–5.1)
Alkaline Phosphatase: 89 U/L (ref 40–115)
BUN: 6 mg/dL — ABNORMAL LOW (ref 7–25)
CO2: 22 mmol/L (ref 20–31)
Calcium: 8.9 mg/dL (ref 8.6–10.3)
Chloride: 102 mmol/L (ref 98–110)
Creat: 0.71 mg/dL (ref 0.70–1.33)
GFR, Est African American: >89 mL/min (ref >=60)
GFR, Est Non African American: >89 mL/min (ref >=60)
GLUCOSE: 74 mg/dL (ref 65–99)
Potassium: 4 mmol/L (ref 3.5–5.3)
SODIUM: 137 mmol/L (ref 135–146)
Total Bilirubin: 0.4 mg/dL (ref 0.2–1.2)
Total Protein: 6.4 g/dL (ref 6.1–8.1)

## 2015-05-03 MED ORDER — KETOCONAZOLE 2 % EX CREA: 1.0000 "application " | TOPICAL_CREAM | Freq: Every day | CUTANEOUS | Status: AC

## 2015-05-03 MED ORDER — DIPHENHYDRAMINE HCL 50 MG PO CAPS
50.0000 mg | ORAL_CAPSULE | Freq: Once | ORAL | Status: AC
  Administered 2015-05-03: 50 mg via ORAL

## 2015-05-03 MED ORDER — DIPHENHYDRAMINE HCL 25 MG PO CAPS: 25.0000 mg | ORAL_CAPSULE | Freq: Four times a day (QID) | ORAL | Status: AC | PRN

## 2015-05-03 NOTE — Progress Notes (Signed)
   Subjective:    Patient ID: Marvin Olson, male    DOB: 1956-03-14, 59 y.o.   MRN: [id number] CC: f/u pruritus, dizziness  HPI 59 yo M presents for f/u visit Spanish interpreter present during exam  1. Pruritus: resolved on scalp. Persist on upper back, palms and soles. Associated rash has resolved. No fever or chills. Patient is out of benadryl which helps. He has worsening pruritus at night. He is also out of antifungal cream.   2. Dizziness: started 1 year ago. Occurred most recently 3 weeks ago. Occurs when walking long distances. Last about 5 minutes. Resolves with rest. No syncope. No CP, SOB or diaphoresis. Patient denies heavy ETOH intake. No bleeding or bruising.   Social History  Substance Use Topics  . Smoking status: Current Every Day Smoker -- 1.00 packs/day    Types: Cigarettes  . Smokeless tobacco: Not on file  . Alcohol Use: No     Comment: 03/26/15: last alcohol use 3 months ago    Review of Systems  Constitutional: Negative for fever, chills, fatigue and unexpected weight change.  Eyes: Positive for visual disturbance.  Respiratory: Negative for cough and shortness of breath.   Cardiovascular: Negative for chest pain, palpitations and leg swelling.  Gastrointestinal: Negative for nausea, vomiting, abdominal pain, diarrhea, constipation and blood in stool.  Endocrine: Negative for polydipsia, polyphagia and polyuria.  Musculoskeletal: Negative for myalgias, back pain, arthralgias, gait problem and neck pain.  Skin: Negative for rash.       Pruritus on upper back, palms and soles   Allergic/Immunologic: Negative for immunocompromised state.  Neurological: Positive for dizziness.  Hematological: Negative for adenopathy. Does not bruise/bleed easily.  Psychiatric/Behavioral: Negative for suicidal ideas, sleep disturbance and dysphoric mood. The patient is not nervous/anxious.       Objective:   Physical Exam  Constitutional: He appears well-developed and  well-nourished. No distress.  HENT:  Head: Normocephalic and atraumatic.  Neck: Normal range of motion. Neck supple.  Cardiovascular: Normal rate, regular rhythm, normal heart sounds and intact distal pulses.   Pulmonary/Chest: Effort normal and breath sounds normal.  Musculoskeletal: He exhibits no edema.  Neurological: He is alert.  Skin: Skin is warm and dry. No rash noted. No erythema.  Psychiatric: He has a normal mood and affect.  BP 114/71 mmHg  Pulse 80  Temp(Src) 98 F (36.7 C)  Resp 16  Wt 136 lb 9.6 oz (61.961 kg)  SpO2 100%   Orthostatic VS obtained reviewed and negative   Lab Results  Component Value Date   HGBA1C 5.50 05/03/2015   CBG 99       Assessment & Plan:

## 2015-05-03 NOTE — Patient Instructions (Addendum)
Mr. Fudala,  Thank you for coming back in to see me   1. Itching on back and hands Refilled benadryl Refilled cream  2. Dizziness previously on HCTZ 12.5 mg daily Stop HCTZ Orthostatic vital signs normal Checking vitamin D, sodium and hemoglobin  Drink water when walking especially in heat Be sure to eat before walking long distances or exercise   F/u in 6 weeks for flu shot and RN BP check  F/u with me in 3 months for BP check and itching   Dr. Armen Pickup

## 2015-05-03 NOTE — Assessment & Plan Note (Signed)
Itching on back and hands Refilled benadryl Refilled cream

## 2015-05-03 NOTE — Assessment & Plan Note (Signed)
Dizziness previously on HCTZ 12.5 mg daily Stop HCTZ Orthostatic vital signs normal Checking vitamin D, sodium and hemoglobin  Drink water when walking especially in heat Be sure to eat before walking long distances or exercise

## 2015-05-03 NOTE — Progress Notes (Signed)
Patient here for follow up Complains of still having itching to his upper torso Patient has been out of his medications and requesting A refill especially on his benadryl

## 2015-05-03 NOTE — Assessment & Plan Note (Signed)
A: normotensive off HCTZ 12.5  P: Removed HCTZ from med list  F/u in 6 weeks to check BP  If BP > 140/90 in 6 weeks, start norvasc 5 mg

## 2015-05-04 LAB — VITAMIN D 25 HYDROXY (VIT D DEFICIENCY, FRACTURES): VIT D 25 HYDROXY: 19 ng/mL — AB (ref 30–100)

## 2015-05-06 DIAGNOSIS — E559 Vitamin D deficiency, unspecified: Secondary | ICD-10-CM | POA: Insufficient documentation

## 2015-05-06 MED ORDER — VITAMIN D (ERGOCALCIFEROL) 1.25 MG (50000 UNIT) PO CAPS: 50000.0000 [IU] | ORAL_CAPSULE | ORAL | Status: AC

## 2015-05-06 NOTE — Addendum Note (Signed)
Addended by: Dessa Phi on: 05/06/2015 09:24 AM   Modules accepted: Orders

## 2015-05-07 ENCOUNTER — Ambulatory Visit: Payer: Self-pay | Admitting: Family Medicine

## 2015-05-31 ENCOUNTER — Telehealth: Payer: Self-pay | Admitting: *Deleted

## 2015-05-31 NOTE — Telephone Encounter (Signed)
LVM to return call.

## 2015-05-31 NOTE — Telephone Encounter (Signed)
-----   Message from Dessa Phi, MD sent at 05/06/2015  9:23 AM EDT ----- Vit D deficiency will replace, Low vit D can be cause of dizziness All other labs stable

## 2015-06-14 ENCOUNTER — Ambulatory Visit: Payer: Self-pay | Attending: Family Medicine

## 2015-06-14 VITALS — BP 144/78 | HR 72 | Temp 97.8°F | Resp 18 | Ht 68.0 in | Wt 140.0 lb

## 2015-06-14 DIAGNOSIS — Z720 Tobacco use: Secondary | ICD-10-CM | POA: Insufficient documentation

## 2015-06-14 DIAGNOSIS — I10 Essential (primary) hypertension: Secondary | ICD-10-CM | POA: Insufficient documentation

## 2015-06-14 DIAGNOSIS — Z0000 Encounter for general adult medical examination without abnormal findings: Secondary | ICD-10-CM | POA: Insufficient documentation

## 2015-06-14 MED ORDER — AMLODIPINE BESYLATE 5 MG PO TABS: 5.0000 mg | ORAL_TABLET | Freq: Every day | ORAL | Status: AC

## 2015-06-14 NOTE — Progress Notes (Signed)
Patient here for BP check and flu shot. Patient reports feeling good but has "an itch that will not go away". Patient has had "itch" all over body for 7 months. Patient has seen provider here for itching.   Per provider orders as follows: Amlodipine  PO Daily. Refill Benadryl, Give flu shots.

## 2015-06-14 NOTE — Patient Instructions (Addendum)
I have sent amlodipine  to the pharmacy. You will take 1 tablet by mouth daily.   Benadryl is at the pharmacy.   You have received the flu shot today.

## 2015-12-09 ENCOUNTER — Ambulatory Visit: Payer: Self-pay | Admitting: Family Medicine

## 2016-05-26 IMAGING — CR DG CHEST 2V
2 series · 2 of 2 positions shown · non-contrast
Comparison: None

CLINICAL DATA: Rash for 1 month, hands peeling for 3 weeks, smoker

EXAM:
CHEST  2 VIEW

[w chest pa]
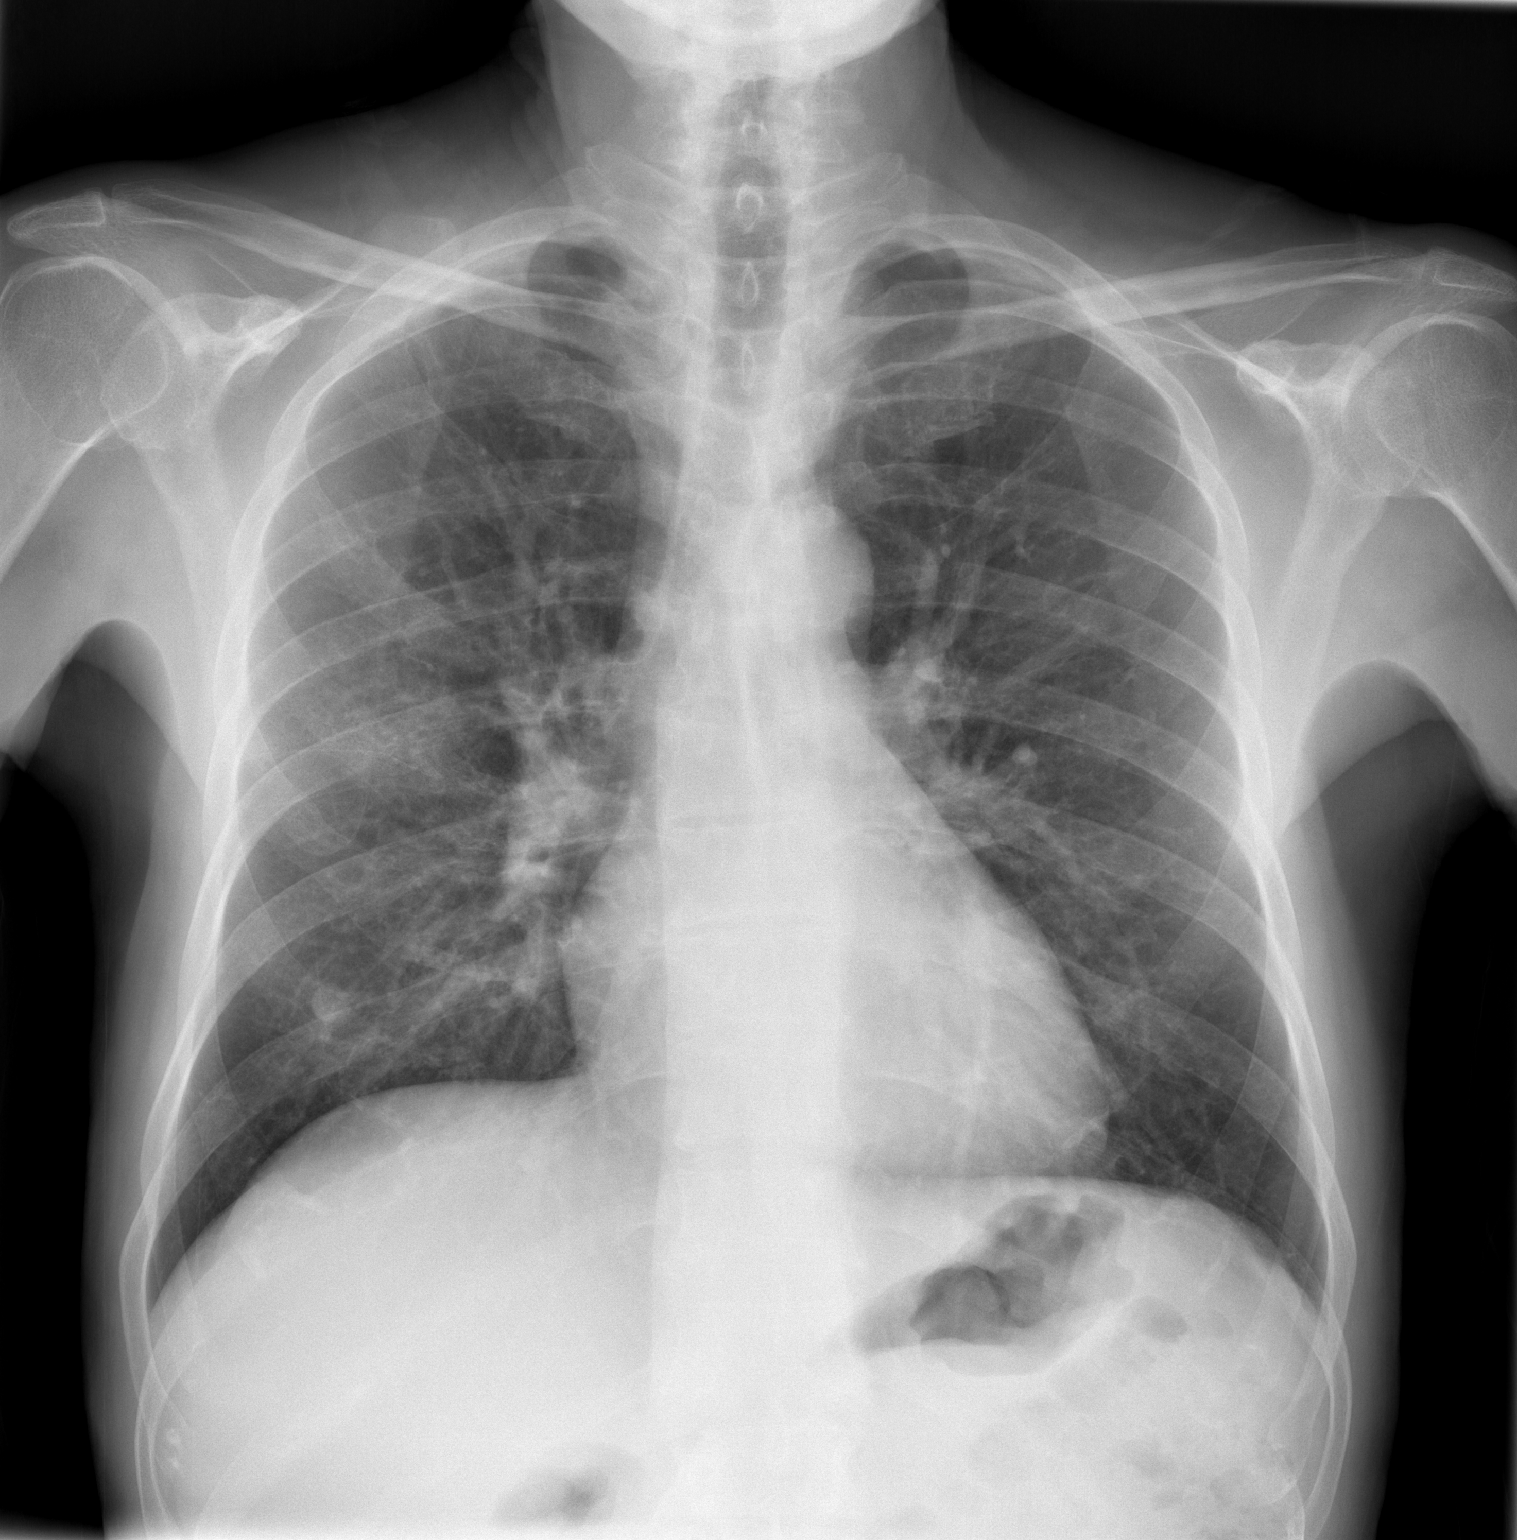

[w chest lat]
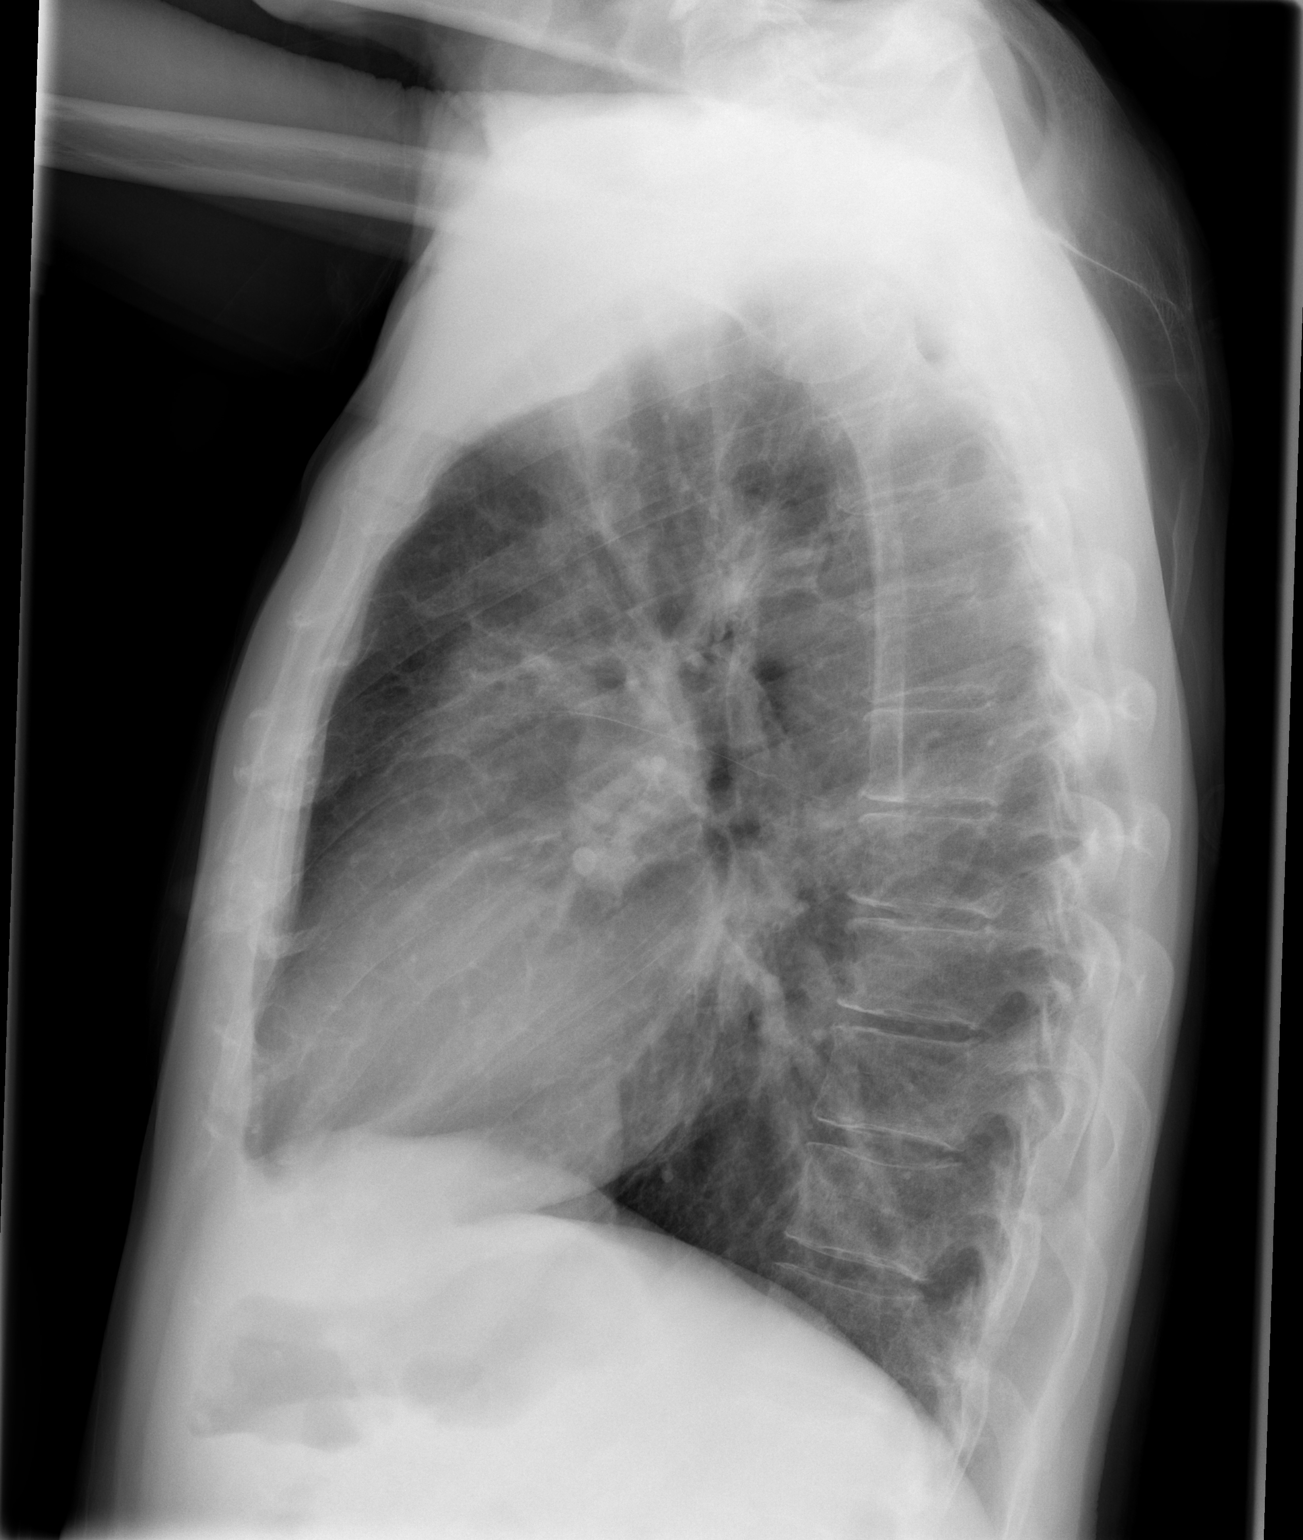

[2 of 2 positions shown; findings below may reference images not displayed]

FINDINGS: Normal heart size, mediastinal contours and pulmonary vascularity.

RIGHT nipple shadow, question faint LEFT nipple shadow.

Small focus of infiltrate in RIGHT mid lung likely in upper lobe
near minor fissure.

Remaining lungs clear.

No pleural effusion or pneumothorax.

No acute osseous findings.
IMPRESSION: Minimal infiltrate RIGHT upper lobe.

## 2019-07-23 DEATH — deceased
